# Patient Record
Sex: Female | Born: 1975 | Race: White | Hispanic: No | Marital: Single
Health system: Southern US, Community
[De-identification: ages and names within clinical notes are randomized; demographics above are authoritative.]

## PROBLEM LIST (undated history)

## (undated) HISTORY — PX: KNEE SURGERY: SHX244

## (undated) HISTORY — PX: HAND SURGERY: SHX662

---

## 2020-06-17 ENCOUNTER — Ambulatory Visit (HOSPITAL_BASED_OUTPATIENT_CLINIC_OR_DEPARTMENT_OTHER)
Admission: RE | Admit: 2020-06-17 | Discharge: 2020-06-17 | Disposition: A | Discharge status: Home / Self Care | Payer: BC Managed Care – PPO | Source: Ambulatory Visit | Attending: Emergency Medicine | Admitting: Emergency Medicine

## 2020-06-17 ENCOUNTER — Emergency Department (HOSPITAL_BASED_OUTPATIENT_CLINIC_OR_DEPARTMENT_OTHER): Payer: BC Managed Care – PPO

## 2020-06-17 ENCOUNTER — Emergency Department (HOSPITAL_BASED_OUTPATIENT_CLINIC_OR_DEPARTMENT_OTHER)
Admission: EM | Admit: 2020-06-17 | Discharge: 2020-06-17 | Disposition: A | Discharge status: Home / Self Care | Payer: BC Managed Care – PPO | Attending: Emergency Medicine | Admitting: Emergency Medicine

## 2020-06-17 ENCOUNTER — Other Ambulatory Visit: Payer: Self-pay

## 2020-06-17 ENCOUNTER — Encounter (HOSPITAL_BASED_OUTPATIENT_CLINIC_OR_DEPARTMENT_OTHER): Payer: Self-pay | Admitting: Emergency Medicine

## 2020-06-17 ENCOUNTER — Ambulatory Visit (HOSPITAL_BASED_OUTPATIENT_CLINIC_OR_DEPARTMENT_OTHER): Admission: RE | Admit: 2020-06-17 | Payer: BC Managed Care – PPO | Source: Ambulatory Visit

## 2020-06-17 DIAGNOSIS — R600 Localized edema: Secondary | ICD-10-CM | POA: Insufficient documentation

## 2020-06-17 DIAGNOSIS — R42 Dizziness and giddiness: Secondary | ICD-10-CM | POA: Diagnosis not present

## 2020-06-17 DIAGNOSIS — H538 Other visual disturbances: Secondary | ICD-10-CM | POA: Insufficient documentation

## 2020-06-17 DIAGNOSIS — M79605 Pain in left leg: Secondary | ICD-10-CM | POA: Insufficient documentation

## 2020-06-17 DIAGNOSIS — R002 Palpitations: Secondary | ICD-10-CM | POA: Insufficient documentation

## 2020-06-17 DIAGNOSIS — F17220 Nicotine dependence, chewing tobacco, uncomplicated: Secondary | ICD-10-CM | POA: Insufficient documentation

## 2020-06-17 DIAGNOSIS — R079 Chest pain, unspecified: Secondary | ICD-10-CM | POA: Diagnosis not present

## 2020-06-17 LAB — CBC WITH DIFFERENTIAL/PLATELET
Abs Immature Granulocytes: 0.02 10*3/uL (ref 0.00–0.07)
Basophils Absolute: 0 10*3/uL (ref 0.0–0.1)
Basophils Relative: 0 %
Eosinophils Absolute: 0.1 10*3/uL (ref 0.0–0.5)
Eosinophils Relative: 1 %
HCT: 39.4 % (ref 36.0–46.0)
Hemoglobin: 12.8 g/dL (ref 12.0–15.0)
Immature Granulocytes: 0 %
Lymphocytes Relative: 32 %
Lymphs Abs: 2.2 10*3/uL (ref 0.7–4.0)
MCH: 25.9 pg — ABNORMAL LOW (ref 26.0–34.0)
MCHC: 32.5 g/dL (ref 30.0–36.0)
MCV: 79.6 fL — ABNORMAL LOW (ref 80.0–100.0)
Monocytes Absolute: 0.6 10*3/uL (ref 0.1–1.0)
Monocytes Relative: 8 %
Neutro Abs: 4.1 10*3/uL (ref 1.7–7.7)
Neutrophils Relative %: 59 %
Platelets: 374 10*3/uL (ref 150–400)
RBC: 4.95 MIL/uL (ref 3.87–5.11)
RDW: 15.7 % — ABNORMAL HIGH (ref 11.5–15.5)
WBC: 7 10*3/uL (ref 4.0–10.5)
nRBC: 0 % (ref 0.0–0.2)

## 2020-06-17 LAB — BASIC METABOLIC PANEL
Anion gap: 9 (ref 5–15)
BUN: 19 mg/dL (ref 6–20)
CO2: 24 mmol/L (ref 22–32)
Calcium: 8.8 mg/dL — ABNORMAL LOW (ref 8.9–10.3)
Chloride: 104 mmol/L (ref 98–111)
Creatinine, Ser: 0.82 mg/dL (ref 0.44–1.00)
GFR, Estimated: >60 mL/min (ref >60)
Glucose, Bld: 94 mg/dL (ref 70–99)
Potassium: 3.5 mmol/L (ref 3.5–5.1)
Sodium: 137 mmol/L (ref 135–145)

## 2020-06-17 LAB — TROPONIN I (HIGH SENSITIVITY): Troponin I (High Sensitivity): 3 ng/L (ref <18)

## 2020-06-17 MED ORDER — IOHEXOL 350 MG/ML SOLN
100.0000 mL | Freq: Once | INTRAVENOUS | Status: AC | PRN
  Administered 2020-06-17: 100 mL via INTRAVENOUS

## 2020-06-17 MED ORDER — DOXYCYCLINE HYCLATE 100 MG PO CAPS: 100.0000 mg | ORAL_CAPSULE | Freq: Two times a day (BID) | ORAL | 0 refills | Status: AC

## 2020-06-17 NOTE — ED Provider Notes (Signed)
[facility] EMERGENCY DEPARTMENT Provider Note   CSN: [id number] Arrival date & time: 06/17/20  0108     History Chief Complaint  Patient presents with   Leg Pain    Elizabeth George is a 45 y.o. female.  Presents to ER with concern for multiple symptoms.  States that Elizabeth George chief complaint is Elizabeth George left leg pain.  States that around 2 weeks ago Elizabeth George suffered a minor injury to Elizabeth George left knee, had some pain at the kneecap.  This seemed to have been getting better but then over the past couple days has noted swelling and slight redness in the left leg.  Pain is primarily in the calf and behind Elizabeth George knee.  Moderate, no alleviating or aggravating factors.  Is able to bear weight.  No numbness or tingling.  Also states last night Elizabeth George had an episode of having chest pain and palpitations and felt lightheaded.  No episodes of passing out.  States pain occurred at rest, not associated with exertion.  Central, nonradiating.  Does not have any ongoing chest pain at present.  Patient as a secondary compalint reported that Elizabeth George also had felt like Elizabeth George had some floaters and blurry vision in Elizabeth George right eye.  States that this happened earlier today and seems to have improved.  Does not recall any injury, no foreign body sensation.  No pain in eye.  Contact lens wearer.  HPI     History reviewed. No pertinent past medical history.  There are no problems to display for this patient.   Past Surgical History:  Procedure Laterality Date   HAND SURGERY     KNEE SURGERY       OB History   No obstetric history on file.     History reviewed. No pertinent family history.  Social History   Tobacco Use   Smokeless tobacco: Current  Substance Use Topics   Alcohol use: Yes    Home Medications Prior to Admission medications   Medication Sig Start Date End Date Taking? Authorizing Provider  doxycycline (VIBRAMYCIN) 100 MG capsule Take 1 capsule (100 mg total) by mouth 2 (two) times daily for 7  days. 06/17/20 06/24/20 Yes Milagros Loll, MD    Allergies    Penicillins  Review of Systems   Review of Systems  Constitutional:  Negative for chills and fever.  HENT:  Negative for ear pain and sore throat.   Eyes:  Negative for pain and visual disturbance.  Respiratory:  Positive for shortness of breath. Negative for cough.   Cardiovascular:  Positive for chest pain and leg swelling. Negative for palpitations.  Gastrointestinal:  Negative for abdominal pain and vomiting.  Genitourinary:  Negative for dysuria and hematuria.  Musculoskeletal:  Positive for arthralgias. Negative for back pain.  Skin:  Positive for rash. Negative for color change.  Neurological:  Negative for seizures and syncope.  All other systems reviewed and are negative.  Physical Exam Updated Vital Signs BP 132/89   Pulse 90   Temp 98.1 F (36.7 C) (Oral)   Resp 19   Ht 5\' 6"  (1.676 m)   Wt 135 kg   SpO2 98%   BMI 48.04 kg/m   Physical Exam Vitals and nursing note reviewed.  Constitutional:      General: Elizabeth George is not in acute distress.    Appearance: Elizabeth George is well-developed.  HENT:     Head: Normocephalic and atraumatic.  Eyes:     Conjunctiva/sclera: Conjunctivae normal.  Comments: Both eyes appear normal, pupils are equal and briskly reactive to light, normal EOM, normal visual fields  Cardiovascular:     Rate and Rhythm: Normal rate and regular rhythm.     Heart sounds: No murmur heard. Pulmonary:     Effort: Pulmonary effort is normal. No respiratory distress.     Breath sounds: Normal breath sounds.  Abdominal:     Palpations: Abdomen is soft.     Tenderness: There is no abdominal tenderness.  Musculoskeletal:     Cervical back: Neck supple.     Comments: Left lower leg has mild generalized erythema, blanchable, mild swelling to lower leg, normal DP and PT pulses, sensation to light touch intact, motor intact  Skin:    General: Skin is warm and dry.  Neurological:     General: No  focal deficit present.     Mental Status: Elizabeth George is alert.    ED Results / Procedures / Treatments   Labs (all labs ordered are listed, but only abnormal results are displayed) Labs Reviewed  CBC WITH DIFFERENTIAL/PLATELET - Abnormal; Notable for the following components:      Result Value   MCV 79.6 (*)    MCH 25.9 (*)    RDW 15.7 (*)    All other components within normal limits  BASIC METABOLIC PANEL - Abnormal; Notable for the following components:   Calcium 8.8 (*)    All other components within normal limits  TROPONIN I (HIGH SENSITIVITY)    EKG EKG Interpretation  Date/Time:  Tuesday June 17 2020 02:09:15 EDT Ventricular Rate:  93 PR Interval:  178 QRS Duration: 98 QT Interval:  387 QTC Calculation: 482 R Axis:   33 Text Interpretation: Sinus rhythm Confirmed by Marianna Fuss ([id number]) on 06/17/2020 2:50:53 AM  Radiology CT Angio Chest PE W and/or Wo Contrast  Result Date: 06/17/2020 CLINICAL DATA:  Unilateral leg swelling, chest pain, high probability of pulmonary embolism. EXAM: CT ANGIOGRAPHY CHEST WITH CONTRAST TECHNIQUE: Multidetector CT imaging of the chest was performed using the standard protocol during bolus administration of intravenous contrast. Multiplanar CT image reconstructions and MIPs were obtained to evaluate the vascular anatomy. CONTRAST:  OMNIPAQUE IOHEXOL 350 MG/ML SOLN COMPARISON:  None. FINDINGS: Cardiovascular: Satisfactory opacification the pulmonary arteries to the segmental level. No pulmonary artery filling defects are identified. Central pulmonary arteries are normal caliber. Normal heart size. No pericardial effusion. The aortic root is suboptimally assessed given cardiac pulsation artifact. The aorta is normal caliber. No acute luminal abnormality of the imaged aorta. No periaortic stranding or hemorrhage. Normal 3 vessel branching of the aortic arch. Proximal great vessels are unremarkable. Mediastinum/Nodes: Fatty stippling in the anterior  mediastinum. No mediastinal fluid or gas. Few tiny subcentimeter hypoattenuating foci and calcifications in the thyroid gland, unlikely to be clinically significant. No acute abnormality of the trachea or esophagus. No worrisome mediastinal, hilar or axillary adenopathy. Lungs/Pleura: Small amount of dependent patchy ground-glass in the posterior apices and superior segment right lower lobe. No pneumothorax or visible effusion. No other focal consolidative opacity. No pneumothorax or pleural effusion. No convincing CT evidence of pulmonary edema. Airways are patent. Upper Abdomen: No acute abnormalities present in the visualized portions of the upper abdomen. Musculoskeletal: No acute osseous abnormality or suspicious osseous lesion. Review of the MIP images confirms the above findings. IMPRESSION: No evidence of pulmonary artery embolism to the segmental level. Patchy dependent ground-glass in the bilateral apices and superior segment right lower lobe, favor atelectasis in the absence of  infectious symptoms. Fatty stippling in the anterior mediastinum, likely to reflect thymic remnant in the absence of other concerning features or history of trauma. Several tiny subcentimeter nodules and calcifications in the thyroid gland. Not clinically significant; no follow-up imaging recommended (ref: J Am Coll Radiol. 2015 Feb;12(2): 143-50). Electronically Signed   By: Kreg Shropshire M.D.   On: 06/17/2020 03:17   DG Knee Complete 4 Views Left  Result Date: 06/17/2020 CLINICAL DATA:  Knee pain, swelling and increased pain following minor injury 2 weeks prior EXAM: LEFT KNEE - COMPLETE 4+ VIEW COMPARISON:  None. FINDINGS: Soft tissue stranding and edematous changes are noted about the knee, most pronounced laterally. No soft tissue gas or foreign body is seen. No discernible collection within the limitations of radiography. Mild tricompartmental degenerative changes of the knee. No sizable effusion. No acute bony  abnormality. Specifically, no fracture, subluxation, or dislocation. Corticated fabella posteriorly. IMPRESSION: Mild diffuse soft tissue edema most pronounced over the lateral aspect of the knee. No soft tissue gas or foreign body. No acute osseous abnormality. Electronically Signed   By: Kreg Shropshire M.D.   On: 06/17/2020 02:43    Procedures Procedures   Medications Ordered in ED Medications  iohexol (OMNIPAQUE) 350 MG/ML injection 100 mL (100 mLs Intravenous Contrast Given 06/17/20 0301)    ED Course  I have reviewed the triage vital signs and the nursing notes.  Pertinent labs & imaging results that were available during my care of the patient were reviewed by me and considered in my medical decision making (see chart for details).    MDM Rules/Calculators/A&P                          45 year old lady presented to ER with concern for left leg pain and swelling.  On review of systems Elizabeth George also endorsed having some transient right eye vision changes and episode of chest pain and feeling short of breath last night.  Elizabeth George EKG did not have any acute ischemic change and troponin was within normal limits, no ongoing cardiac complaint, doubt ACS given these findings.  Given the unilateral leg swelling and associated chest pain/shortness of breath, checked CT scan which was negative for PE.  Regarding Elizabeth George leg, Elizabeth George had removed ported remote trauma to Elizabeth George knee.  Knee x-ray was negative.  Recommend patient come back to ER to get ultrasound to look for DVT.  Patient states that Elizabeth George will come back first later this morning.  Recommended initiating blood thinner now but patient declined.  There was some mild redness on Elizabeth George leg.  If the DVT study is negative, then believe patient would benefit from a course of antibiotics for possible cellulitis.  If DVT study is positive, patient will need anticoagulation initiated.  Regarding the eye complaint.  Elizabeth George does not have ongoing eye complaint at present, normal visual  acuity, normal visual fields, normal EOM, normal pupils.  Recommend Elizabeth George follow-up with Elizabeth George ophthalmologist.  Donette Larry vitals are stable and Elizabeth George remains well-appearing.  D/c home with plan to come back for DVT study in a few hours.    After the discussed management above, the patient was determined to be safe for discharge.  The patient was in agreement with this plan and all questions regarding their care were answered.  ED return precautions were discussed and the patient will return to the ED with any significant worsening of condition.  Final Clinical Impression(s) / ED Diagnoses Final diagnoses:  Left  leg pain    Rx / DC Orders ED Discharge Orders          Ordered    US Venous Img Lower Unilateral Left        06/17/20 0343    doxycycline (VIBRAMYCIN) 100 MG capsule  2 times daily        06/17/20 0346             Milagros Lollykstra, Avannah Decker S, MD 06/17/20 [phone]

## 2020-06-17 NOTE — Discharge Instructions (Signed)
Come back to ER at 8 AM to get the ultrasound done.  If this shows a blood clot, you will need to take a blood thinner.  If this is negative, then I recommend taking an antibiotic for possible skin infection.  If you develop worsening redness, swelling despite treatment, come back to ER for reassessment.  Additionally regarding your eye complaint, please call your eye doctor later this morning to get a close follow-up appointment.  If they are unable to see you within the next day or 2 then call our ophthalmologist listed in these instructions for an appointment.  If you develop worsening pain, visual loss, come back to ER for reassessment.  Regarding your chest pain, if you have any recurrent chest pain or difficulty breathing, you should also come back to ER for reassessment.  Please follow up with your primary care doctor.

## 2020-06-17 NOTE — ED Notes (Signed)
Attempted PIV once, able to draw blood but would not flush; RT to see about [place] PIV; pt tolerated well

## 2020-06-17 NOTE — ED Triage Notes (Signed)
Pt reports minor injury to left knee x 2 weeks ago with sudden onset of swelling and increased pain yesterday.

## 2022-02-03 IMAGING — CT CT ANGIO CHEST
2 of 9 series · 18 of 36 positions shown · IV contrast (Omnipaque)
Comparison: None.

CLINICAL DATA: Unilateral leg swelling, chest pain, high
probability of pulmonary embolism.

EXAM:
CT ANGIOGRAPHY CHEST WITH CONTRAST
TECHNIQUE: Multidetector CT imaging of the chest was performed using the
standard protocol during bolus administration of intravenous
contrast. Multiplanar CT image reconstructions and MIPs were
obtained to evaluate the vascular anatomy.
CONTRAST:  100mL OMNIPAQUE IOHEXOL 350 MG/ML SOLN

[Series 7: pe thins · axial · 0.87mm/px · z∈[+1153,+1394]mm · 17 of 357 slices shown]
[im 18/357  lung]
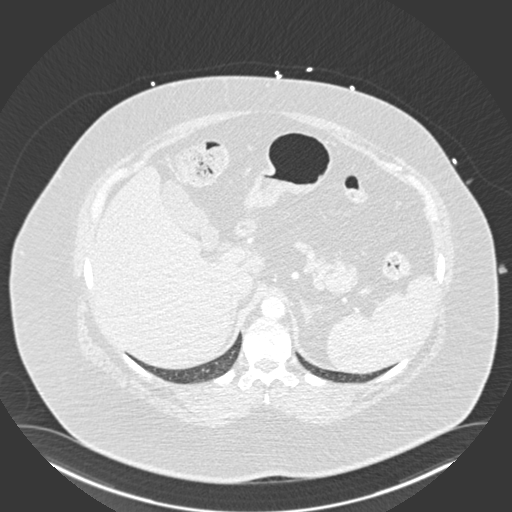
[im 36/357  mediastinal]
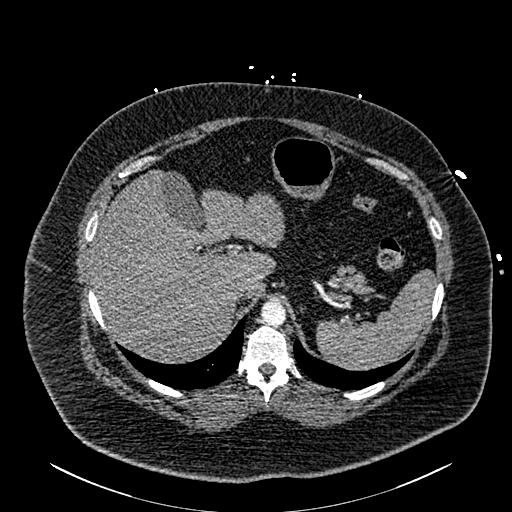
[im 54/357  lung]
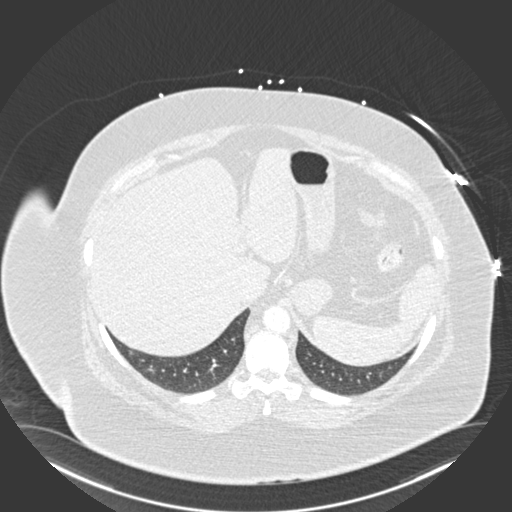
[im 72/357  mediastinal]
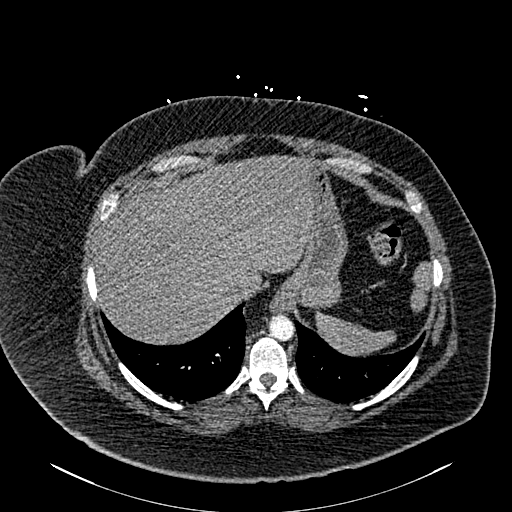
[im 107/357  lung]
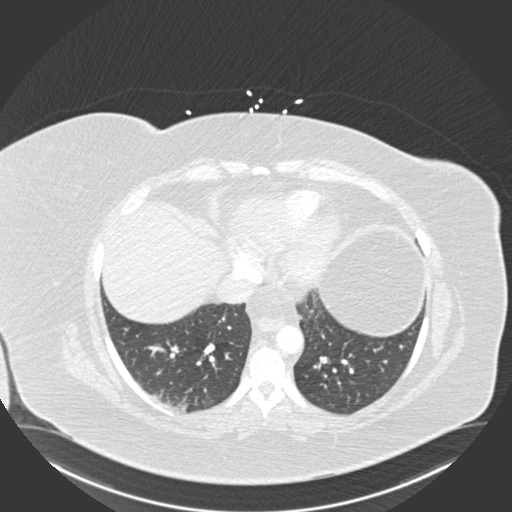
[im 125/357  mediastinal]
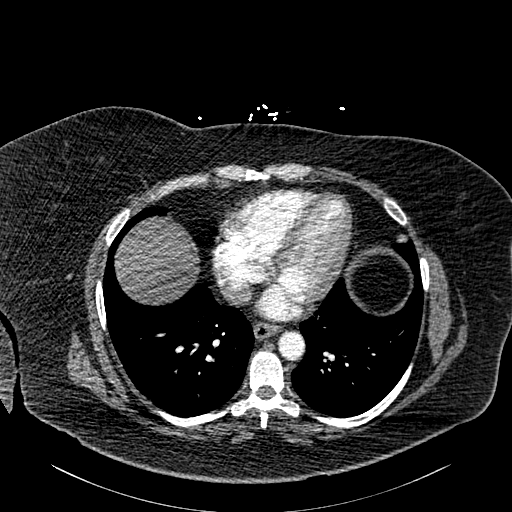
[im 143/357  lung]
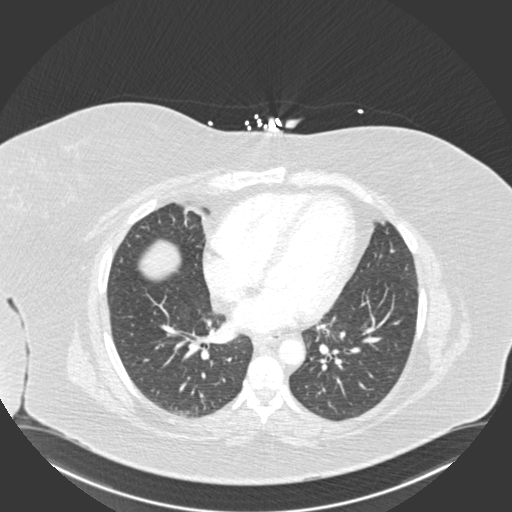
[im 161/357  mediastinal]
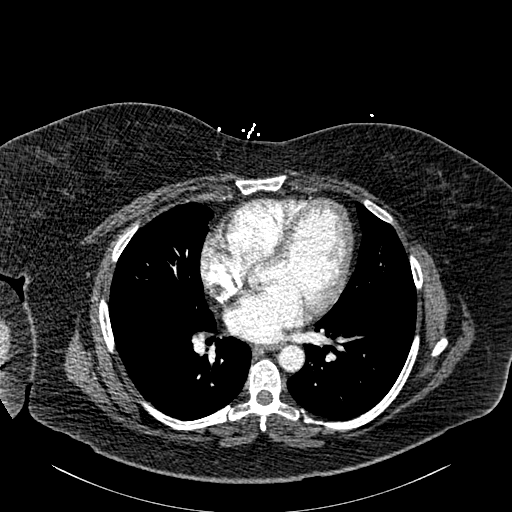
[im 179/357  lung]
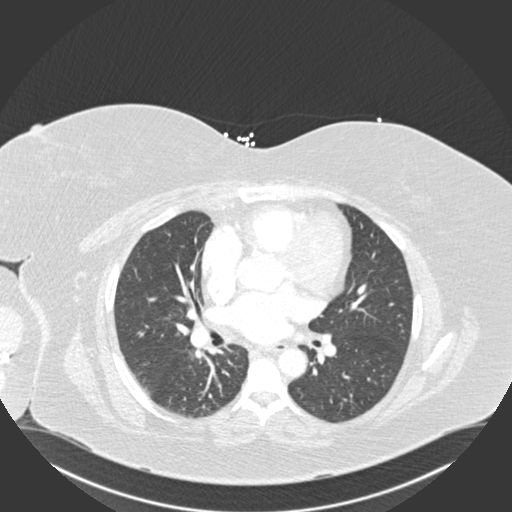
[im 196/357  mediastinal]
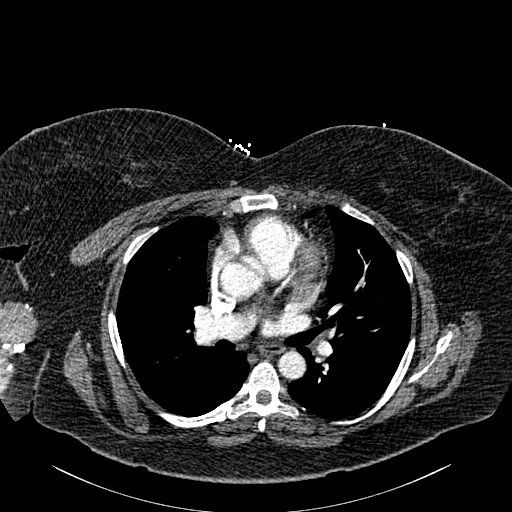
[im 214/357  lung]
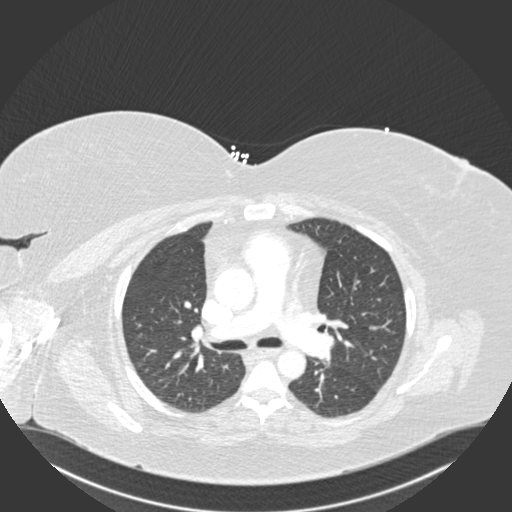
[im 232/357  mediastinal]
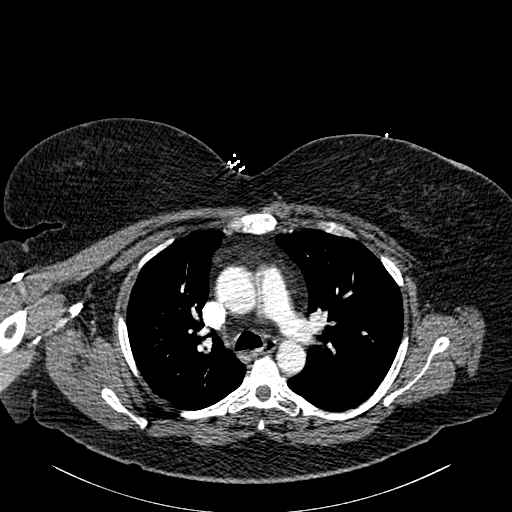
[im 250/357  lung]
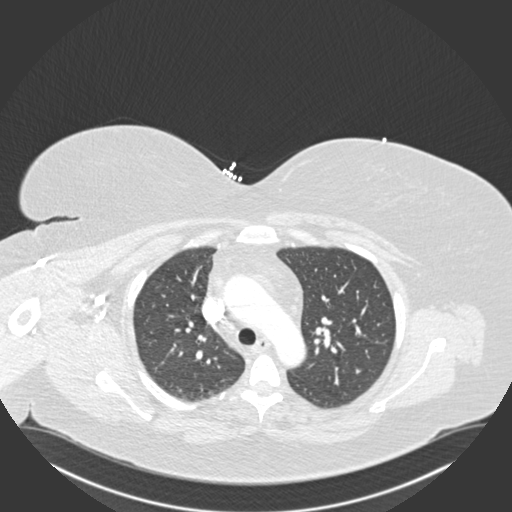
[im 285/357  mediastinal]
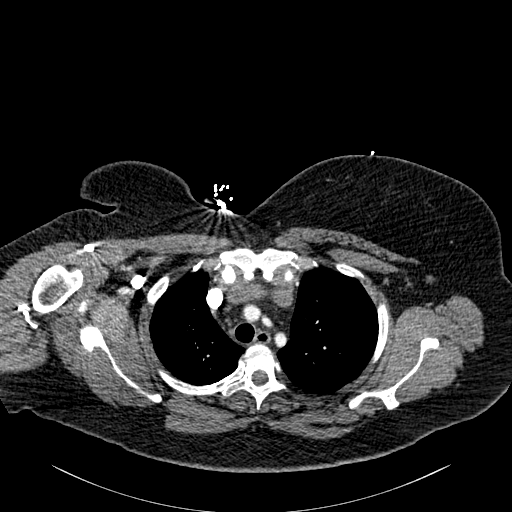
[im 303/357  lung]
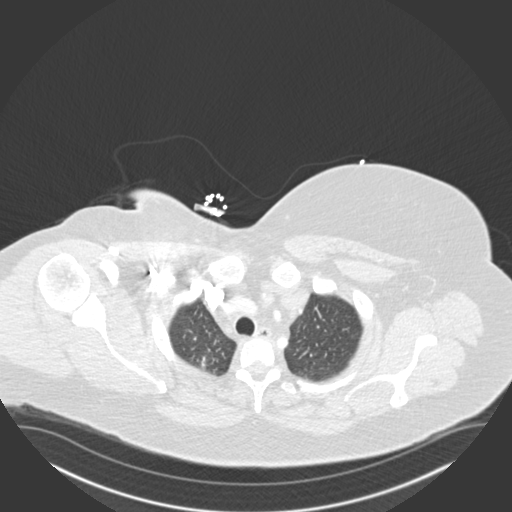
[im 321/357  mediastinal]
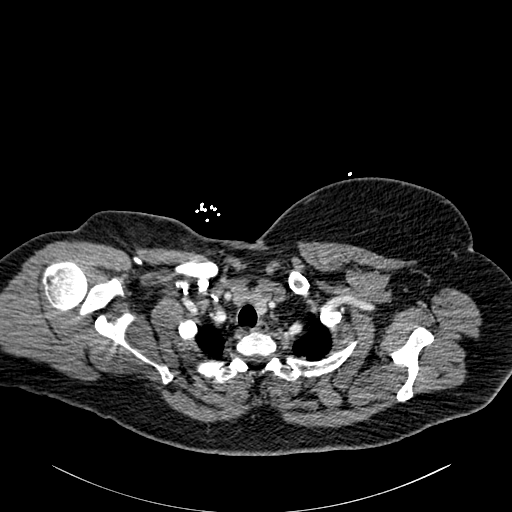
[im 339/357  lung]
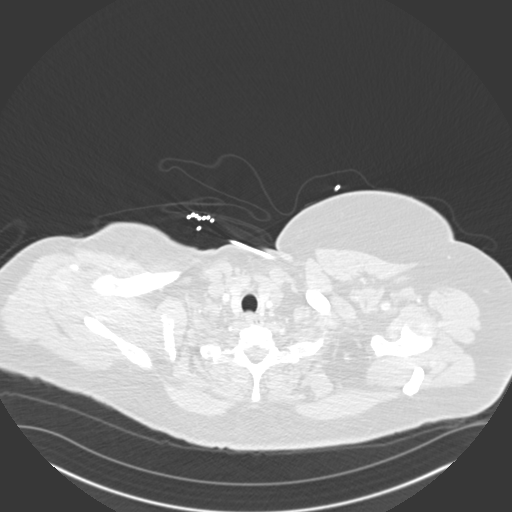

[Series 8: pe coronal mpr · coronal · 0.54mm/px · 1 of 101 slices shown]
[im 51/101  mediastinal]
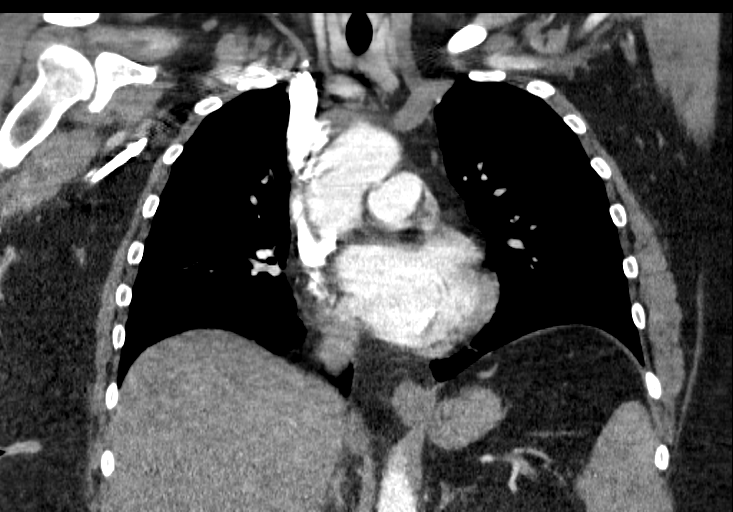

[18 of 36 positions shown; findings below may reference images not displayed]

FINDINGS: Cardiovascular: Satisfactory opacification the pulmonary arteries to
the segmental level. No pulmonary artery filling defects are
identified. Central pulmonary arteries are normal caliber. Normal
heart size. No pericardial effusion. The aortic root is suboptimally
assessed given cardiac pulsation artifact. The aorta is normal
caliber. No acute luminal abnormality of the imaged aorta. No
periaortic stranding or hemorrhage. Normal 3 vessel branching of the
aortic arch. Proximal great vessels are unremarkable.

Mediastinum/Nodes: Fatty stippling in the anterior mediastinum. No
mediastinal fluid or gas. Few tiny subcentimeter hypoattenuating
foci and calcifications in the thyroid gland, unlikely to be
clinically significant. No acute abnormality of the trachea or
esophagus. No worrisome mediastinal, hilar or axillary adenopathy.

Lungs/Pleura: Small amount of dependent patchy ground-glass in the
posterior apices and superior segment right lower lobe. No
pneumothorax or visible effusion. No other focal consolidative
opacity. No pneumothorax or pleural effusion. No convincing CT
evidence of pulmonary edema. Airways are patent.

Upper Abdomen: No acute abnormalities present in the visualized
portions of the upper abdomen.

Musculoskeletal: No acute osseous abnormality or suspicious osseous
lesion.

Review of the MIP images confirms the above findings.
IMPRESSION: No evidence of pulmonary artery embolism to the segmental level.

Patchy dependent ground-glass in the bilateral apices and superior
segment right lower lobe, favor atelectasis in the absence of
infectious symptoms.

Fatty stippling in the anterior mediastinum, likely to reflect
thymic remnant in the absence of other concerning features or
history of trauma.

Several tiny subcentimeter nodules and calcifications in the thyroid
gland. Not clinically significant; no follow-up imaging recommended
(ref: [HOSPITAL]. [DATE]): 143-50).
